# Patient Record
Sex: Female | Born: 1940 | Race: White | Hispanic: No | Marital: Married
Health system: Southern US, Academic
[De-identification: ages and names within clinical notes are randomized; demographics above are authoritative.]

## PROBLEM LIST (undated history)

## (undated) ENCOUNTER — Ambulatory Visit

## (undated) ENCOUNTER — Encounter: Attending: Family Medicine | Primary: Family Medicine

## (undated) ENCOUNTER — Encounter

## (undated) ENCOUNTER — Ambulatory Visit: Payer: MEDICARE

## (undated) ENCOUNTER — Ambulatory Visit: Payer: MEDICARE | Attending: Family Medicine | Primary: Family Medicine

## (undated) ENCOUNTER — Telehealth: Attending: Family Medicine | Primary: Family Medicine

## (undated) ENCOUNTER — Other Ambulatory Visit: Attending: Family Medicine | Primary: Family Medicine

## (undated) ENCOUNTER — Telehealth

## (undated) ENCOUNTER — Other Ambulatory Visit

## (undated) ENCOUNTER — Encounter: Attending: Medical | Primary: Medical

## (undated) ENCOUNTER — Encounter: Attending: Dermatology | Primary: Dermatology

## (undated) ENCOUNTER — Ambulatory Visit: Payer: Medicare (Managed Care) | Attending: Family Medicine | Primary: Family Medicine

## (undated) ENCOUNTER — Encounter: Payer: MEDICARE | Attending: Family Medicine | Primary: Family Medicine

## (undated) ENCOUNTER — Telehealth: Attending: Medical | Primary: Medical

## (undated) ENCOUNTER — Ambulatory Visit: Payer: MEDICARE | Attending: Medical | Primary: Medical

## (undated) ENCOUNTER — Ambulatory Visit: Payer: BLUE CROSS/BLUE SHIELD | Attending: Family Medicine | Primary: Family Medicine

## (undated) ENCOUNTER — Ambulatory Visit: Attending: Family Medicine | Primary: Family Medicine

---

## 2013-09-29 ENCOUNTER — Ambulatory Visit: Payer: Self-pay | Admitting: Orthopedic Surgery

## 2013-09-29 LAB — URINALYSIS, COMPLETE
Blood: NEGATIVE
Nitrite: NEGATIVE
Ph: 8 (ref 4.5–8.0)
Protein: NEGATIVE
Specific Gravity: 1.009 (ref 1.003–1.030)
Squamous Epithelial: <1

## 2013-09-29 LAB — COMPREHENSIVE METABOLIC PANEL
Albumin: 3.9 g/dL (ref 3.4–5.0)
Alkaline Phosphatase: 68 U/L
Anion Gap: 6 — ABNORMAL LOW (ref 7–16)
BUN: 13 mg/dL (ref 7–18)
Bilirubin,Total: 0.7 mg/dL (ref 0.2–1.0)
Calcium, Total: 9.5 mg/dL (ref 8.5–10.1)
Co2: 26 mmol/L (ref 21–32)
EGFR (African American): >60
EGFR (Non-African Amer.): >60
Osmolality: 274 (ref 275–301)
Potassium: 4 mmol/L (ref 3.5–5.1)
SGOT(AST): 24 U/L (ref 15–37)
SGPT (ALT): 19 U/L (ref 12–78)
Sodium: 137 mmol/L (ref 136–145)

## 2013-09-29 LAB — CBC
HGB: 14.3 g/dL (ref 12.0–16.0)
MCH: 31.3 pg (ref 26.0–34.0)
MCHC: 33.7 g/dL (ref 32.0–36.0)
MCV: 93 fL (ref 80–100)
WBC: 7.9 10*3/uL (ref 3.6–11.0)

## 2013-09-29 LAB — PROTIME-INR
INR: 1
Prothrombin Time: 13.2 secs (ref 11.5–14.7)

## 2015-01-20 NOTE — H&P (Signed)
PATIENT NAME:  Victoria McardleLLINGTON, Klynn A MR#:  [id number] DATE OF BIRTH:  [birth year]  DATE OF ADMISSION:  09/29/2013  CHIEF COMPLAINT: Left wrist pain.   HISTORY OF PRESENT ILLNESS: The patient suffered a fall at home earlier this morning. She fell on her outstretched left arm. She came to the Emergency Room with obvious deformity. She had x-rays obtained that showed a comminuted distal radius fracture, intra-articular with significant shortening as well as another styloid fracture. She denies any other injury.   ALLERGIES: SHE HAS PAST HISTORY OF PENICILLIN ALLERGY, SHE GETS HIVES.   MEDICATIONS:  She is not on any current medications.   PAST MEDICAL HISTORY: No chronic health issues.   PAST SURGICAL HISTORY: She does have a history of prior D and C x 2. 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   SOCIAL HISTORY:  She is a nonsmoker. She lives at home with her husband and is generally active. She is right-hand-dominant.   REVIEW OF SYSTEMS: Positive just for the left wrist pain.   PHYSICAL EXAMINATION:  GENERAL: Slender white female, who appears her stated age in mild distress.  HEENT: Remarkable for teeth in good condition. Normocephalic, atraumatic head.  LUNGS: Clear.  HEART: Regular rate and rhythm.  ABDOMEN: Soft, nontender.  EXTREMITIES: Remarkable for silver fork deformity to the left distal radius. She is neurovascularly intact.   X-rays show a comminuted distal radius fracture.   CLINICAL IMPRESSION: Comminuted distal radius fracture. She has been n.p.o. since last night with her fall prior to breakfast and so we can fix this today, prior to onset of swelling. The risks, benefits as well as complications were discussed. We will plan on surgery later  today as an outpatient.    ____________________________ Leitha SchullerMichael J. Arif Amendola, MD mjm:aw D: 09/29/2013 11:04:05 ET T: 09/29/2013 11:10:56 ET JOB#: [id number]  cc: Leitha SchullerMichael J. Nikesha Kwasny, MD, <Dictator> Leitha SchullerMICHAEL J Braydn Carneiro  MD ELECTRONICALLY SIGNED 09/29/2013 12:16

## 2015-01-21 NOTE — Op Note (Signed)
PATIENT NAME:  Victoria [name], Victoria [name] MR#:  [id number] DATE OF BIRTH:  May 30, 1941  DATE OF PROCEDURE:  09/29/2013  PREOPERATIVE DIAGNOSIS: Comminuted left distal radius fracture.   POSTOPERATIVE DIAGNOSIS:  Comminuted left distal radius fracture.   PROCEDURE: Open reduction and internal fixation, left distal radius.   ANESTHESIA: General.   SURGEON: Kennedy BuckerMichael Kaelynne Christley, M.D.   DESCRIPTION OF PROCEDURE: The patient was brought to the Operating Room and after adequate anesthesia was obtained, the left arm was prepped and draped in the usual sterile fashion.  After patient identification and timeout procedures were completed, fingertrap traction was applied with 10 pounds traction. The tourniquet was raised to 250 mmHg. [name] volar incision was made over the FCR tendon, the tendon sheath incised and the tendon retracted radially. The deep fascia was then incised and the pronator retracted ulnarly to protect the median nerve. The fracture site was exposed. There was [name] spike volarly and [name] slight amount of additional traction could be keyed into place to get essentially anatomic alignment. Very good position on AP and lateral projections were obtained. [name] narrow DVR plate, 4-hole plate was applied and the distal peg holes were filled first using standard technique, drilling, measuring and placing the smooth pegs. With the fracture held in [name] reduced position, 3 additional proximal cortical screws were placed, drilling, measuring and placing the self-tapping screws. Traction was released and under fluoroscopy the fracture appeared to be well aligned, essentially anatomic alignment on AP and lateral projections and was stable with passive range of motion. At this point, the tourniquet was let down and the wound irrigated. The wound was closed with 3-0 Vicryl subcutaneously and 4-0 nylon skin suture. Xeroform, 4 x 4, Webril and [name] volar splint were applied followed by an Ace wrap.   TOURNIQUET TIME: 31 minutes at 250 mmHg.    COMPLICATIONS: None.   SPECIMEN: None.   IMPLANT: [employer] DVR left narrow plate with screws and smooth pegs.   ____________________________ Leitha SchullerMichael J. Porschea Borys, MD mjm:cs D: 09/29/2013 17:44:26 ET T: 09/29/2013 20:17:53 ET JOB#: [id number]  cc: Leitha SchullerMichael J. Jakeline Dave, MD, <Dictator> Leitha SchullerMICHAEL J Tela Kotecki MD ELECTRONICALLY SIGNED 09/30/2013 9:24

## 2015-06-25 IMAGING — CR DG CHEST 1V PORT
1 series · 1 of 1 positions shown · non-contrast
Comparison: None.

CLINICAL DATA: Preop.

EXAM:
PORTABLE CHEST - 1 VIEW

[ap]
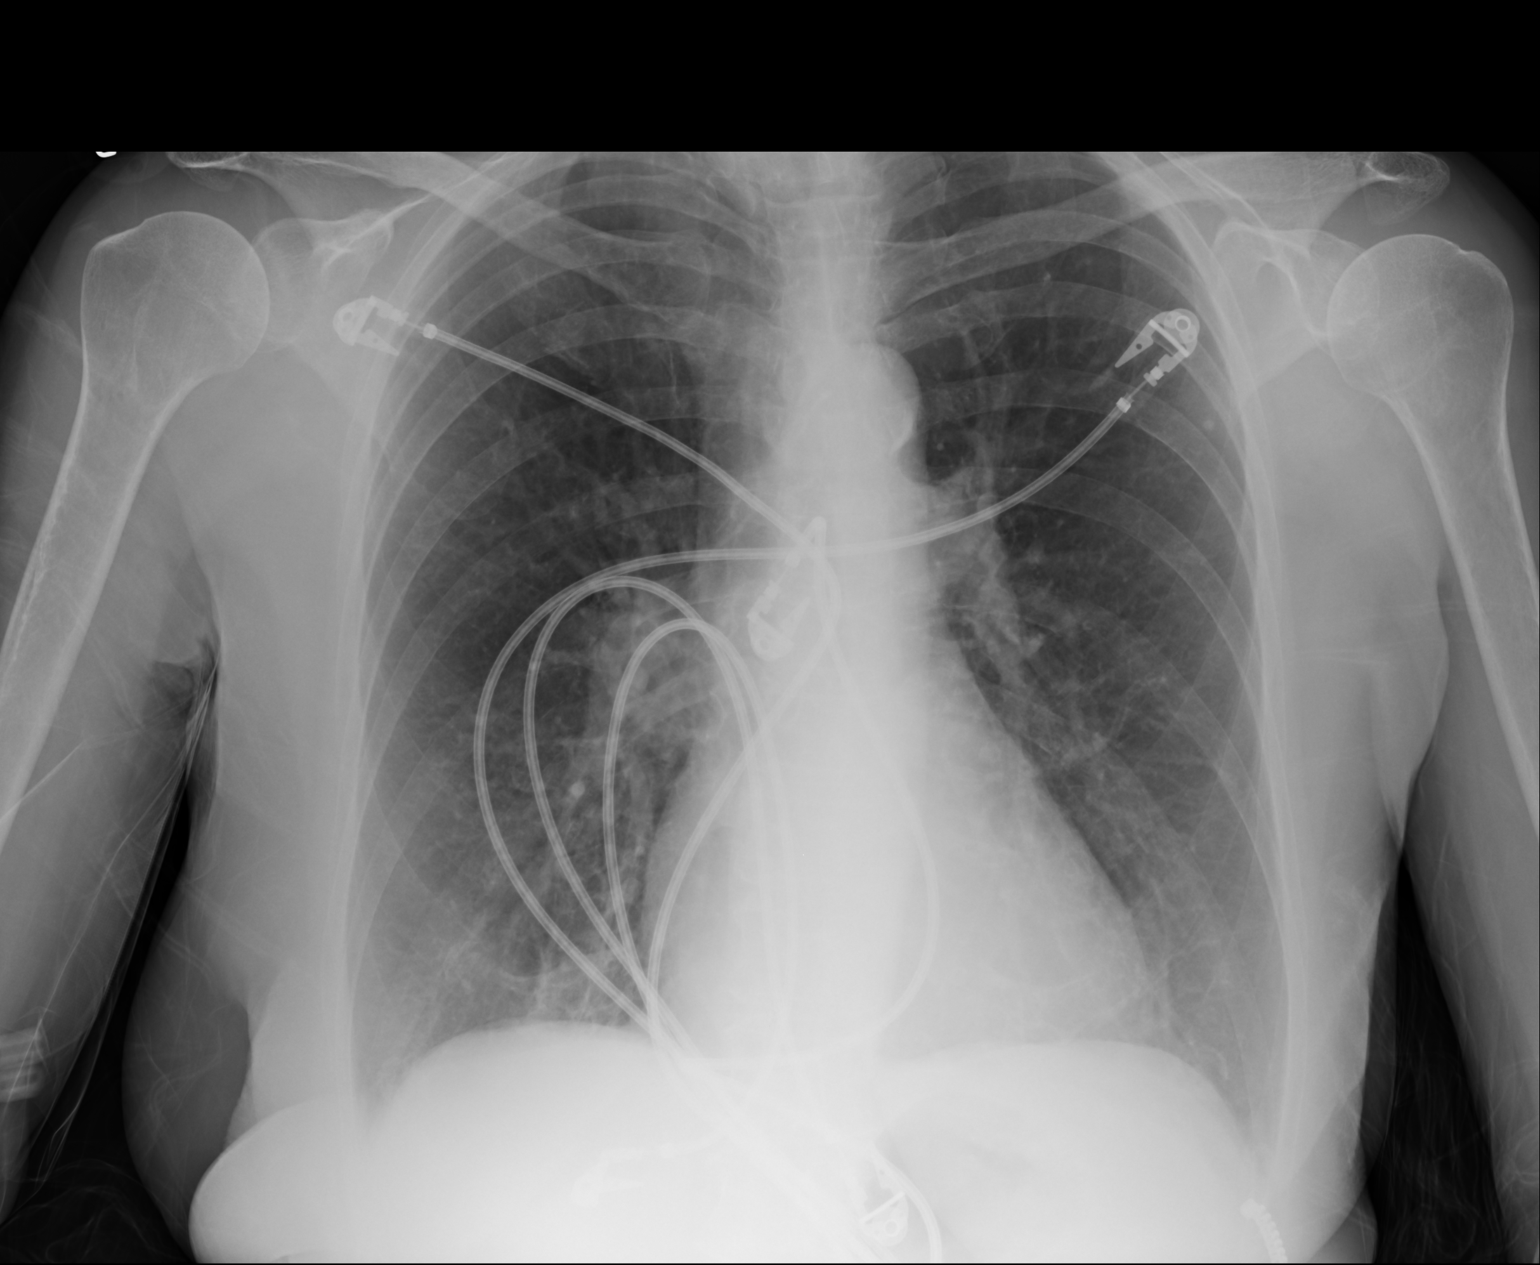

[1 of 1 positions shown; findings below may reference images not displayed]

FINDINGS: Mild hyperinflation of the lungs. Heart is borderline in size. No
confluent airspace opacities, effusions or edema. No acute bony
abnormality.
IMPRESSION: Mild hyperinflation, likely mild COPD.  No active disease.

## 2017-06-09 ENCOUNTER — Ambulatory Visit
Admission: RE | Admit: 2017-06-09 | Discharge: 2017-06-09 | Disposition: A | Discharge status: Home / Self Care | Payer: MEDICARE | Admitting: Family Medicine

## 2017-06-09 DIAGNOSIS — R030 Elevated blood-pressure reading, without diagnosis of hypertension: Secondary | ICD-10-CM

## 2017-06-09 DIAGNOSIS — Z1211 Encounter for screening for malignant neoplasm of colon: Principal | ICD-10-CM

## 2017-06-09 DIAGNOSIS — M810 Age-related osteoporosis without current pathological fracture: Secondary | ICD-10-CM

## 2017-06-09 DIAGNOSIS — R7989 Other specified abnormal findings of blood chemistry: Secondary | ICD-10-CM

## 2017-06-09 DIAGNOSIS — E7800 Pure hypercholesterolemia, unspecified: Secondary | ICD-10-CM

## 2017-06-09 DIAGNOSIS — E559 Vitamin D deficiency, unspecified: Secondary | ICD-10-CM

## 2017-08-11 ENCOUNTER — Ambulatory Visit: Admission: RE | Admit: 2017-08-11 | Discharge: 2017-08-11 | Payer: MEDICARE

## 2017-08-11 DIAGNOSIS — Z1211 Encounter for screening for malignant neoplasm of colon: Principal | ICD-10-CM

## 2018-02-04 ENCOUNTER — Ambulatory Visit: Admit: 2018-02-04 | Discharge: 2018-02-05 | Payer: MEDICARE | Attending: Family Medicine | Primary: Family Medicine

## 2018-02-04 DIAGNOSIS — R030 Elevated blood-pressure reading, without diagnosis of hypertension: Secondary | ICD-10-CM

## 2018-02-04 DIAGNOSIS — L989 Disorder of the skin and subcutaneous tissue, unspecified: Principal | ICD-10-CM

## 2018-02-04 DIAGNOSIS — M810 Age-related osteoporosis without current pathological fracture: Secondary | ICD-10-CM

## 2018-02-04 DIAGNOSIS — R7989 Other specified abnormal findings of blood chemistry: Secondary | ICD-10-CM

## 2018-02-04 DIAGNOSIS — Z23 Encounter for immunization: Secondary | ICD-10-CM

## 2018-02-04 MED ORDER — VARICELLA-ZOSTER GLYCOE VACC-AS01B ADJ(PF) 50 MCG/0.5 ML IM SUSP, KIT
Freq: Once | INTRAMUSCULAR | 0 refills | 0 days | Status: CP
Start: 2018-02-04 — End: 2018-02-04

## 2018-03-02 ENCOUNTER — Encounter: Admit: 2018-03-02 | Discharge: 2018-03-03 | Payer: MEDICARE | Attending: Dermatology | Primary: Dermatology

## 2018-03-02 DIAGNOSIS — L821 Other seborrheic keratosis: Secondary | ICD-10-CM

## 2018-03-02 DIAGNOSIS — D489 Neoplasm of uncertain behavior, unspecified: Principal | ICD-10-CM

## 2018-03-17 ENCOUNTER — Encounter: Admit: 2018-03-17 | Discharge: 2018-03-18 | Payer: MEDICARE

## 2018-03-17 DIAGNOSIS — L578 Other skin changes due to chronic exposure to nonionizing radiation: Secondary | ICD-10-CM

## 2018-03-17 DIAGNOSIS — L988 Other specified disorders of the skin and subcutaneous tissue: Secondary | ICD-10-CM

## 2018-03-17 DIAGNOSIS — L814 Other melanin hyperpigmentation: Secondary | ICD-10-CM

## 2018-03-17 DIAGNOSIS — C4339 Malignant melanoma of other parts of face: Principal | ICD-10-CM

## 2018-03-23 ENCOUNTER — Encounter: Admit: 2018-03-23 | Discharge: 2018-03-24 | Payer: MEDICARE

## 2018-03-23 DIAGNOSIS — Z85828 Personal history of other malignant neoplasm of skin: Principal | ICD-10-CM

## 2018-03-23 MED ORDER — DOXYCYCLINE MONOHYDRATE 100 MG TABLET
ORAL_TABLET | Freq: Two times a day (BID) | ORAL | 0 refills | 0 days | Status: CP
Start: 2018-03-23 — End: 2018-06-06

## 2018-03-26 ENCOUNTER — Encounter: Admit: 2018-03-26 | Discharge: 2018-03-27 | Payer: MEDICARE

## 2018-03-26 DIAGNOSIS — Z85828 Personal history of other malignant neoplasm of skin: Principal | ICD-10-CM

## 2018-04-07 ENCOUNTER — Encounter: Admit: 2018-04-07 | Discharge: 2018-04-08 | Payer: MEDICARE

## 2018-04-07 DIAGNOSIS — Z85828 Personal history of other malignant neoplasm of skin: Principal | ICD-10-CM

## 2018-05-18 ENCOUNTER — Ambulatory Visit: Admit: 2018-05-18 | Discharge: 2018-05-19 | Payer: MEDICARE

## 2018-05-18 DIAGNOSIS — Z85828 Personal history of other malignant neoplasm of skin: Principal | ICD-10-CM

## 2018-06-05 DIAGNOSIS — I10 Essential (primary) hypertension: Principal | ICD-10-CM

## 2018-06-06 ENCOUNTER — Ambulatory Visit: Admit: 2018-06-06 | Discharge: 2018-06-06 | Disposition: A | Discharge status: Home / Self Care | Payer: MEDICARE

## 2018-06-06 ENCOUNTER — Emergency Department: Admit: 2018-06-06 | Discharge: 2018-06-06 | Disposition: A | Discharge status: Home / Self Care | Payer: MEDICARE

## 2018-06-06 ENCOUNTER — Encounter: Admit: 2018-06-06 | Discharge: 2018-06-06 | Disposition: A | Discharge status: Home / Self Care | Payer: MEDICARE

## 2018-06-06 DIAGNOSIS — I10 Essential (primary) hypertension: Principal | ICD-10-CM

## 2018-06-09 ENCOUNTER — Encounter: Admit: 2018-06-09 | Discharge: 2018-06-10 | Payer: MEDICARE

## 2018-06-09 ENCOUNTER — Encounter: Admit: 2018-06-09 | Discharge: 2018-06-10 | Payer: MEDICARE | Attending: Family Medicine | Primary: Family Medicine

## 2018-06-09 DIAGNOSIS — Z0000 Encounter for general adult medical examination without abnormal findings: Principal | ICD-10-CM

## 2018-06-09 DIAGNOSIS — R030 Elevated blood-pressure reading, without diagnosis of hypertension: Secondary | ICD-10-CM

## 2018-06-09 DIAGNOSIS — F411 Generalized anxiety disorder: Principal | ICD-10-CM

## 2018-06-09 DIAGNOSIS — R3129 Other microscopic hematuria: Secondary | ICD-10-CM

## 2018-06-09 MED ORDER — BUSPIRONE 5 MG TABLET
ORAL_TABLET | Freq: Two times a day (BID) | ORAL | 1 refills | 0 days | Status: CP
Start: 2018-06-09 — End: 2018-08-26

## 2018-06-19 ENCOUNTER — Encounter: Admit: 2018-06-19 | Discharge: 2018-06-20 | Payer: MEDICARE | Attending: Clinical | Primary: Clinical

## 2018-06-19 DIAGNOSIS — Z1330 Encounter for screening examination for mental health and behavioral disorders, unspecified: Secondary | ICD-10-CM

## 2018-06-19 DIAGNOSIS — F411 Generalized anxiety disorder: Principal | ICD-10-CM

## 2018-06-24 ENCOUNTER — Encounter: Admit: 2018-06-24 | Discharge: 2018-06-25 | Payer: MEDICARE | Attending: Family Medicine | Primary: Family Medicine

## 2018-06-24 DIAGNOSIS — F411 Generalized anxiety disorder: Principal | ICD-10-CM

## 2018-06-24 DIAGNOSIS — R3129 Other microscopic hematuria: Secondary | ICD-10-CM

## 2018-06-24 DIAGNOSIS — R030 Elevated blood-pressure reading, without diagnosis of hypertension: Secondary | ICD-10-CM

## 2018-08-10 ENCOUNTER — Ambulatory Visit: Admit: 2018-08-10 | Discharge: 2018-08-11 | Payer: MEDICARE

## 2018-08-10 DIAGNOSIS — Z85828 Personal history of other malignant neoplasm of skin: Principal | ICD-10-CM

## 2018-08-26 ENCOUNTER — Encounter: Admit: 2018-08-26 | Discharge: 2018-08-27 | Payer: MEDICARE | Attending: Family Medicine | Primary: Family Medicine

## 2018-08-26 DIAGNOSIS — R030 Elevated blood-pressure reading, without diagnosis of hypertension: Secondary | ICD-10-CM

## 2018-08-26 DIAGNOSIS — F411 Generalized anxiety disorder: Principal | ICD-10-CM

## 2018-08-26 MED ORDER — BUSPIRONE 5 MG TABLET
ORAL_TABLET | Freq: Two times a day (BID) | ORAL | 3 refills | 0 days | Status: CP
Start: 2018-08-26 — End: 2019-05-11

## 2018-12-28 ENCOUNTER — Encounter: Admit: 2018-12-28 | Discharge: 2018-12-29 | Payer: MEDICARE | Attending: Family Medicine | Primary: Family Medicine

## 2018-12-28 DIAGNOSIS — I10 Essential (primary) hypertension: Principal | ICD-10-CM

## 2018-12-28 DIAGNOSIS — F411 Generalized anxiety disorder: Principal | ICD-10-CM

## 2018-12-28 DIAGNOSIS — M1990 Unspecified osteoarthritis, unspecified site: Principal | ICD-10-CM

## 2018-12-28 DIAGNOSIS — R030 Elevated blood-pressure reading, without diagnosis of hypertension: Principal | ICD-10-CM

## 2019-05-11 ENCOUNTER — Encounter: Admit: 2019-05-11 | Discharge: 2019-05-12 | Payer: MEDICARE | Attending: Family Medicine | Primary: Family Medicine

## 2019-05-11 DIAGNOSIS — R946 Abnormal results of thyroid function studies: Secondary | ICD-10-CM

## 2019-05-11 DIAGNOSIS — F411 Generalized anxiety disorder: Principal | ICD-10-CM

## 2019-05-11 DIAGNOSIS — E559 Vitamin D deficiency, unspecified: Secondary | ICD-10-CM

## 2019-05-11 DIAGNOSIS — R7989 Other specified abnormal findings of blood chemistry: Secondary | ICD-10-CM

## 2019-05-11 DIAGNOSIS — Z85820 Personal history of malignant melanoma of skin: Secondary | ICD-10-CM

## 2019-05-11 DIAGNOSIS — R3129 Other microscopic hematuria: Secondary | ICD-10-CM

## 2019-05-11 DIAGNOSIS — M810 Age-related osteoporosis without current pathological fracture: Secondary | ICD-10-CM

## 2019-05-11 DIAGNOSIS — E7800 Pure hypercholesterolemia, unspecified: Secondary | ICD-10-CM

## 2019-05-11 DIAGNOSIS — R030 Elevated blood-pressure reading, without diagnosis of hypertension: Secondary | ICD-10-CM

## 2019-05-11 MED ORDER — BUSPIRONE 5 MG TABLET
ORAL_TABLET | Freq: Two times a day (BID) | ORAL | 6 refills | 30 days | Status: CP
Start: 2019-05-11 — End: 2020-05-10

## 2019-10-05 ENCOUNTER — Encounter: Admit: 2019-10-05 | Discharge: 2019-10-06 | Payer: MEDICARE | Attending: Family Medicine | Primary: Family Medicine

## 2019-10-05 DIAGNOSIS — F411 Generalized anxiety disorder: Principal | ICD-10-CM

## 2019-10-05 DIAGNOSIS — R030 Elevated blood-pressure reading, without diagnosis of hypertension: Principal | ICD-10-CM

## 2019-11-04 ENCOUNTER — Encounter: Admit: 2019-11-04 | Discharge: 2019-11-05 | Payer: MEDICARE | Attending: Family Medicine | Primary: Family Medicine

## 2019-11-04 DIAGNOSIS — F411 Generalized anxiety disorder: Principal | ICD-10-CM

## 2019-11-04 DIAGNOSIS — I10 Essential (primary) hypertension: Principal | ICD-10-CM

## 2019-11-04 MED ORDER — AMLODIPINE 2.5 MG TABLET
ORAL_TABLET | Freq: Every day | ORAL | 1 refills | 30 days | Status: CP
Start: 2019-11-04 — End: 2020-11-03

## 2019-11-22 ENCOUNTER — Encounter: Admit: 2019-11-22 | Discharge: 2019-11-23 | Payer: MEDICARE | Attending: Family Medicine | Primary: Family Medicine

## 2019-11-22 DIAGNOSIS — I10 Essential (primary) hypertension: Principal | ICD-10-CM

## 2019-11-22 MED ORDER — AMLODIPINE 2.5 MG TABLET
ORAL_TABLET | Freq: Every day | ORAL | 6 refills | 30 days | Status: CP
Start: 2019-11-22 — End: 2020-11-21

## 2020-01-20 ENCOUNTER — Encounter: Admit: 2020-01-20 | Discharge: 2020-01-21 | Payer: MEDICARE | Attending: Family Medicine | Primary: Family Medicine

## 2020-01-20 DIAGNOSIS — E7800 Pure hypercholesterolemia, unspecified: Principal | ICD-10-CM

## 2020-01-20 DIAGNOSIS — F411 Generalized anxiety disorder: Principal | ICD-10-CM

## 2020-01-20 DIAGNOSIS — M810 Age-related osteoporosis without current pathological fracture: Principal | ICD-10-CM

## 2020-01-20 DIAGNOSIS — R3129 Other microscopic hematuria: Principal | ICD-10-CM

## 2020-01-20 DIAGNOSIS — I10 Essential (primary) hypertension: Principal | ICD-10-CM

## 2020-01-20 DIAGNOSIS — Z85820 Personal history of malignant melanoma of skin: Principal | ICD-10-CM

## 2020-05-03 ENCOUNTER — Encounter: Admit: 2020-05-03 | Discharge: 2020-05-03 | Payer: MEDICARE | Attending: Family Medicine | Primary: Family Medicine

## 2020-05-03 ENCOUNTER — Encounter: Admit: 2020-05-03 | Discharge: 2020-05-03 | Payer: MEDICARE

## 2020-05-03 DIAGNOSIS — F411 Generalized anxiety disorder: Principal | ICD-10-CM

## 2020-05-03 DIAGNOSIS — E7800 Pure hypercholesterolemia, unspecified: Principal | ICD-10-CM

## 2020-05-03 DIAGNOSIS — M810 Age-related osteoporosis without current pathological fracture: Principal | ICD-10-CM

## 2020-05-03 DIAGNOSIS — R946 Abnormal results of thyroid function studies: Principal | ICD-10-CM

## 2020-05-03 DIAGNOSIS — Z0000 Encounter for general adult medical examination without abnormal findings: Principal | ICD-10-CM

## 2020-05-03 DIAGNOSIS — E559 Vitamin D deficiency, unspecified: Principal | ICD-10-CM

## 2020-05-03 DIAGNOSIS — I10 Essential (primary) hypertension: Principal | ICD-10-CM

## 2020-05-03 DIAGNOSIS — R3129 Other microscopic hematuria: Principal | ICD-10-CM

## 2020-05-03 DIAGNOSIS — Z85820 Personal history of malignant melanoma of skin: Principal | ICD-10-CM

## 2020-11-16 ENCOUNTER — Encounter: Admit: 2020-11-16 | Discharge: 2020-11-17 | Payer: MEDICARE

## 2020-12-21 ENCOUNTER — Ambulatory Visit: Admit: 2020-12-21 | Discharge: 2020-12-22 | Payer: MEDICARE

## 2020-12-25 ENCOUNTER — Encounter: Admit: 2020-12-25 | Discharge: 2020-12-26 | Payer: MEDICARE | Attending: Family Medicine | Primary: Family Medicine

## 2020-12-25 DIAGNOSIS — M810 Age-related osteoporosis without current pathological fracture: Principal | ICD-10-CM

## 2020-12-25 DIAGNOSIS — E559 Vitamin D deficiency, unspecified: Principal | ICD-10-CM

## 2020-12-25 DIAGNOSIS — Z85820 Personal history of malignant melanoma of skin: Principal | ICD-10-CM

## 2020-12-25 DIAGNOSIS — R946 Abnormal results of thyroid function studies: Principal | ICD-10-CM

## 2020-12-25 DIAGNOSIS — E7800 Pure hypercholesterolemia, unspecified: Principal | ICD-10-CM

## 2020-12-25 DIAGNOSIS — I10 Essential (primary) hypertension: Principal | ICD-10-CM

## 2020-12-25 DIAGNOSIS — F411 Generalized anxiety disorder: Principal | ICD-10-CM

## 2020-12-25 MED ORDER — AMLODIPINE 2.5 MG TABLET
ORAL_TABLET | Freq: Every day | ORAL | 6 refills | 30 days | Status: CP
Start: 2020-12-25 — End: 2021-12-25

## 2021-07-05 ENCOUNTER — Ambulatory Visit: Admit: 2021-07-05 | Discharge: 2021-07-06 | Payer: MEDICARE | Attending: Family Medicine | Primary: Family Medicine

## 2021-07-05 DIAGNOSIS — I10 Essential (primary) hypertension: Principal | ICD-10-CM

## 2021-07-05 DIAGNOSIS — F411 Generalized anxiety disorder: Principal | ICD-10-CM

## 2021-07-05 DIAGNOSIS — M810 Age-related osteoporosis without current pathological fracture: Principal | ICD-10-CM

## 2021-07-05 DIAGNOSIS — Z85820 Personal history of malignant melanoma of skin: Principal | ICD-10-CM

## 2021-07-05 DIAGNOSIS — E7800 Pure hypercholesterolemia, unspecified: Principal | ICD-10-CM

## 2021-07-05 MED ORDER — AMLODIPINE 2.5 MG TABLET
ORAL_TABLET | Freq: Every day | ORAL | 6 refills | 30 days | Status: CP
Start: 2021-07-05 — End: 2022-07-05

## 2021-07-05 MED ORDER — ALENDRONATE 70 MG TABLET
ORAL_TABLET | ORAL | 6 refills | 28 days | Status: CP
Start: 2021-07-05 — End: 2022-07-05

## 2022-01-03 ENCOUNTER — Ambulatory Visit: Admit: 2022-01-03 | Discharge: 2022-01-04 | Payer: MEDICARE | Attending: Family Medicine | Primary: Family Medicine

## 2022-01-03 DIAGNOSIS — E7800 Pure hypercholesterolemia, unspecified: Principal | ICD-10-CM

## 2022-01-03 DIAGNOSIS — Z85820 Personal history of malignant melanoma of skin: Principal | ICD-10-CM

## 2022-01-03 DIAGNOSIS — I10 Essential (primary) hypertension: Principal | ICD-10-CM

## 2022-01-03 DIAGNOSIS — F411 Generalized anxiety disorder: Principal | ICD-10-CM

## 2022-01-03 DIAGNOSIS — M810 Age-related osteoporosis without current pathological fracture: Principal | ICD-10-CM

## 2022-02-14 ENCOUNTER — Ambulatory Visit: Admit: 2022-02-14 | Discharge: 2022-02-15 | Payer: MEDICARE

## 2022-04-20 DIAGNOSIS — M810 Age-related osteoporosis without current pathological fracture: Principal | ICD-10-CM

## 2022-05-22 ENCOUNTER — Ambulatory Visit: Admit: 2022-05-22 | Discharge: 2022-05-23 | Payer: MEDICARE | Attending: Medical | Primary: Medical

## 2022-05-22 DIAGNOSIS — R634 Abnormal weight loss: Principal | ICD-10-CM

## 2022-05-22 DIAGNOSIS — R030 Elevated blood-pressure reading, without diagnosis of hypertension: Principal | ICD-10-CM

## 2022-05-22 DIAGNOSIS — M810 Age-related osteoporosis without current pathological fracture: Principal | ICD-10-CM

## 2022-05-22 MED ORDER — DENOSUMAB 60 MG/ML SUBCUTANEOUS SYRINGE
SUBCUTANEOUS | 1 refills | 180 days | Status: CP
Start: 2022-05-22 — End: ?
  Filled 2022-05-30: qty 1, 180d supply, fill #0

## 2022-05-23 LAB — PARATHYROID HORMONE (PTH): PARATHYROID HORMONE INTACT: 46.5 pg/mL (ref 18.4–80.1)

## 2022-05-23 NOTE — Unmapped (Signed)
[facility] Specialty Medication Onboarding    Specialty Medication: Prolia  Prior Authorization: Not Required   Financial Assistance: No - copay  <$25  Final Copay/Day Supply: $10.35 / 180    Insurance Restrictions: None     Notes to Pharmacist:     The triage team has completed the benefits investigation and has determined that the patient is able to fill this medication at [facility]. Please contact the patient to complete the onboarding or follow up with the prescribing physician as needed.

## 2022-05-23 NOTE — Unmapped (Signed)
[facility]   Patient Onboarding/Medication Counseling    Anita Solis is a 81 y.o. female with Age-related osteoporosis without current pathological fracture who I am counseling today on initiation of therapy.  I am speaking to the patient.    Was a Nurse, learning disability used for this call? No    Verified patient's date of birth / HIPAA.    Specialty medication(s) to be sent: General Specialty: Prolia      Non-specialty medications/supplies to be sent: n/a      Medications not needed at this time: n/a         Prolia (denosumbab) syringe    Medication & Administration     Dosage: Inject 1ml (60mg ) under the skin once every 6 months    Administration: Administered in clinic    Missed dose instructions: If you miss a dose, administer it as soon as possible. Fracture risk may increase if dose is not administered within 7 months of prior dose. Thereafter, schedule doses every 6 months from the date of the last injection.      Goals of Therapy     Prevention of osteoporosis/bone loss    Side Effects & Monitoring Parameters     Injection site irritation  Back pain  Headache  Signs of common cold  Pain in arms or legs  Muscle or joint pain  Osteonecrosis of the jaw (jaw bone pain or numbness of the jaw)(should have routine dental exams)      The following side effects should be reported to the provider:  Low calcium levels, like muscle cramps or spasms, numbness and tingling, or seizures  Mouth sores  Swelling in arms or legs  Feeling very tired or weak  New or strange groin, hip, or thigh pain  Shortness of breath  Jaw swelling or pain  Pancreas problem like very bad stomach pain, very bad back pain, or very bad upset stomach or throwing up  Infection like fever, chills, very bad sore throat, ear or sinus pain, cough, more sputum or change in color of sputum, pain with passing urine, or wound that will not heal  Skin infection like oozing, heat, swelling, redness, or pain  High or low blood pressure like very bad headache or dizziness, passing out, or change in eyesight  Small bumps or patches on your skin, dry skin, or if your skin feels like leather  Bladder pain or pain when passing urine or in how much or frequency with which urine is passed    Contraindications, Warnings, & Precautions     Hypersensitivity to denosumab  Hypersensitivity to latex (needle cap and vial stopper contains latex)  Osteonecrosis of the jaw - should have dental exam prior to starting treatment  Unusual thigh bone fractures  Increased risk of broken bones, including broken bones in the spine, after stopping, skipping, or delaying Prolia  Serious infections  May cause hypocalcemia - May require calcium and Vitamin D supplements  REMS Program - no pharmacy requirements    Drug/Food Interactions     Medication list reviewed in Epic. The patient was instructed to inform the care team before taking any new medications or supplements. No drug interactions identified.   May need calcium and Vitamin D supplements    Storage, Handling Precautions, & Disposal     Prolia should be stored in the refrigerator.   Avoid freezing   Throw away if it has been left at room temperature for more than 14 days  Do not shake  Protect from direct heat and light  Keep out of the reach of children  Only use the syringe/needle one time then discard  Place used devices into a sharps container for disposal (which we can supply along with band-aids and alcohol pads) or hard plastic container       Current Medications (including OTC/herbals), Comorbidities and Allergies     Current Outpatient Medications   Medication Sig Dispense Refill    amLODIPine (NORVASC) 2.5 MG tablet Take 1 tablet (2.5 mg total) by mouth daily. 30 tablet 6    aspirin (ECOTRIN) 81 MG tablet Take 1 tablet (81 mg total) by mouth daily. (Patient not taking: Reported on 05/22/2022) 150 tablet 2    calcium carbonate-vitamin D3 600 mg (1,500 mg)-800 unit Chew Chew 1 each Two (2) times a day. clindamycin (CLEOCIN) 150 MG capsule TAKE 4 CAPSULES BY MOUTH 1 HOUR BEFORE APPT      denosumab (PROLIA) 60 mg/mL Syrg Inject 1 mL (60 mg total) under the skin every six (6) months. 1 mL 1    omega-3/dha/epa/fish oil (FISH OIL-OMEGA-3 FATTY ACIDS) 300-1,000 mg capsule Take 1 capsule (1 g total) by mouth daily. Takes one 2 times per day      zinc sulfate (ZINC-220 ORAL) Take 22 mg by mouth daily.       No current facility-administered medications for this visit.       Allergies   Allergen Reactions    Penicillins      Other reaction(s): RASH       Patient Active Problem List   Diagnosis    Vitamin D deficiency    Mammogram declined    Colonoscopy refused    Refused pneumococcal vaccine    Osteoporosis    Serum calcium elevated    Elevated TSH    White coat syndrome with high blood pressure without hypertension    Refused influenza vaccine    Refused pneumococcal vaccination    Pure hypercholesterolemia    Microhematuria    Generalized anxiety disorder    History of malignant melanoma    Melanoma of cheek (CMS-HCC)       Reviewed and up to date in Epic.    Appropriateness of Therapy     Acute infections noted within Epic:  No active infections  Patient reported infection: None    Is medication and dose appropriate based on diagnosis and infection status? Yes    Prescription has been clinically reviewed: Yes      Baseline Quality of Life Assessment      How many days over the past month did your condition  keep you from your normal activities? For example, brushing your teeth or getting up in the morning. 0    Financial Information     Medication Assistance provided: None Required    Anticipated copay of $10.35 reviewed with patient. Verified delivery address.    Delivery Information     Scheduled delivery date: 05/31/22    Expected start date: TBD - needs to make MD appt and take with her     Medication will be delivered via UPS to the prescription address in [facility].  This shipment will not require a signature. Explained the services we provide at [facility] and that each month we would call to set up refills.  Stressed importance of returning phone calls so that we could ensure they receive their medications in time each month.  Informed patient that we should be setting up refills 7-10  days prior to when they will run out of medication.  A pharmacist will reach out to perform a clinical assessment periodically.  Informed patient that a welcome packet, containing information about our pharmacy and other support services, a Notice of Privacy Practices, and a drug information handout will be sent.      The patient or caregiver noted above participated in the development of this care plan and knows that they can request review of or adjustments to the care plan at any time.      Patient or caregiver verbalized understanding of the above information as well as how to contact the pharmacy at [phone] option 4 with any questions/concerns.  The pharmacy is open Monday through Friday 8:30am-4:30pm.  A pharmacist is available 24/7 via pager to answer any clinical questions they may have.    Patient Specific Needs     Does the patient have any physical, cognitive, or cultural barriers? No    Does the patient have adequate living arrangements? (i.e. the ability to store and take their medication appropriately) Yes    Did you identify any home environmental safety or security hazards? No    Patient prefers to have medications discussed with  Patient     Is the patient or caregiver able to read and understand education materials at a high school level or above? Yes    Patient's primary language is  English     Is the patient high risk? No    SOCIAL DETERMINANTS OF HEALTH     At the [facility], we have learned that life circumstances - like trouble affording food, housing, utilities, or transportation can affect the health of many of our patients.   That is why we wanted to ask: are you currently experiencing any life circumstances that are negatively impacting your health and/or quality of life? No    Social Determinants of Health     Financial Resource Strain: Low Risk  (05/03/2020)    Overall Financial Resource Strain (CARDIA)     Difficulty of Paying Living Expenses: Not hard at all   Internet Connectivity: Not on file   Food Insecurity: No Food Insecurity (05/03/2020)    Hunger Vital Sign     Worried About Running Out of Food in the Last Year: Never true     Ran Out of Food in the Last Year: Never true   Tobacco Use: Low Risk  (01/03/2022)    Patient History     Smoking Tobacco Use: Never     Smokeless Tobacco Use: Never     Passive Exposure: Not on file   Housing/Utilities: Low Risk  (05/03/2020)    Housing/Utilities     Within the past 12 months, have you ever stayed: outside, in a car, in a tent, in an overnight shelter, or temporarily in someone else's home (i.e. couch-surfing)?: No     Are you worried about losing your housing?: No     Within the past 12 months, have you been unable to get utilities (heat, electricity) when it was really needed?: No   Alcohol Use: Not At Risk (01/03/2022)    Alcohol Use     How often do you have a drink containing alcohol?: Never     How many drinks containing alcohol do you have on a typical day when you are drinking?: 1 - 2     How often do you have 5 or more drinks on one occasion?: Never   Transportation Needs: No Transportation  Needs (01/03/2022)    PRAPARE - Transportation     Lack of Transportation (Medical): No     Lack of Transportation (Non-Medical): No   Substance Use: Low Risk  (05/03/2020)    Substance Use     Taken prescription drugs for non-medical reasons: Never     Taken illegal drugs: Never     Patient indicated they have taken drugs in the past year for non-medical reasons: Yes, [positive answer(s)]: Not on file   Health Literacy: Low Risk  (05/03/2020)    Health Literacy     : Never   Physical Activity: Sufficiently Active (05/03/2020)    Exercise Vital Sign Days of Exercise per Week: 7 days     Minutes of Exercise per Session: 30 min   Interpersonal Safety: Not on file   Stress: No Stress Concern Present (05/03/2020)    [employer] of Occupational Health - Occupational Stress Questionnaire     Feeling of Stress : Not at all   Intimate Partner Violence: Not At Risk (05/03/2020)    Humiliation, Afraid, Rape, and Kick questionnaire     Fear of Current or Ex-Partner: No     Emotionally Abused: No     Physically Abused: No     Sexually Abused: No   Depression: Not at risk (12/25/2020)    PHQ-2     PHQ-2 Score: 0   Social Connections: Moderately Integrated (05/03/2020)    Social Connection and Isolation Panel [NHANES]     Frequency of Communication with Friends and Family: More than three times a week     Frequency of Social Gatherings with Friends and Family: More than three times a week     Attends Religious Services: More than 4 times per year     Active Member of [employer] or Organizations: No     Attends Banker Meetings: Never     Marital Status: Married       Would you be willing to receive help with any of the needs that you have identified today? Not applicable       Shaylea Ucci Vangie Bicker  [facility] Specialty Pharmacist

## 2022-05-27 LAB — VITAMIN D 25 HYDROXY: VITAMIN D, TOTAL (25OH): 49.3 ng/mL (ref 20.0–80.0)

## 2022-05-28 DIAGNOSIS — M810 Age-related osteoporosis without current pathological fracture: Principal | ICD-10-CM

## 2022-05-30 ENCOUNTER — Institutional Professional Consult (permissible substitution): Admit: 2022-05-30 | Discharge: 2022-05-31 | Payer: MEDICARE

## 2022-05-30 DIAGNOSIS — Z0000 Encounter for general adult medical examination without abnormal findings: Principal | ICD-10-CM

## 2022-05-30 NOTE — Unmapped (Addendum)
Thank kindly you for completing your Medicare Annual Wellness Visit today. If you have any questions about your Medicare Annual Wellness Visit please call our Care Manager, Johnanna Schneiders, RN CM at [phone]  Please see your updated Personalized Prevention Plan which details the status of your preventative services, agreed upon goals to improve your fitness and health, and educational materials on health topics specific to you.     Here is your personalized prevention plan based on your Annual Wellness Visit today.    Medicare Screening & Prevention Guidelines Recommendations Dates Completed HM Status and Next Due Follow-Up   Colorectal Cancer Screening Patients 50 to 75: stool cards annually OR colonoscopy every 10 years (or more frequently if high risk) OR FIT-DNA every 3 years.  Colonoscopy date: Not Found  FOBT/FIT date: Not Found  Sigmoidoscopy date: Not Found  FIT-DNA date: Not Found Health Maintenance Summary     This patient has no relevant Health Maintenance data.       Not within age range   DEXA Bone Density Measurement Patients age 44-95 to have a Dexa every 5 years in postmenopausal women and high risk males. Males will defer to PCP. DEXA date: 02/14/2022 Health Maintenance Summary    -      DEXA Scan (Every 5 Years) Next due on 02/15/2027    02/14/2022  Dexa Bone Density Skeletal    05/25/2014  Dexa Bone Density Skeletal    10/09/2010  Diagnostic DEXA [facility] Historical Result)             Up to date   Heart Disease Screening (fasting lipid panel) Minimum of every 5 years, patients age 64-75,  if no apparent signs or symptoms of heart disease. LDL date: 07/05/2021  Total choleseterol date: 07/05/2021  HDL date: 07/05/2021  Triglycerides date: 07/05/2021 Health Maintenance Summary     This patient has no relevant Health Maintenance data.       Not within age range   Mammogram Screening Age 80-74 every 2 years.  Mammogram date: 08/14/2016 Health Maintenance Summary     This patient has no relevant Health Maintenance data.     Not within age range   Pelvic Exam & Pap Smear Women ages 16 to 34 every 3 years with negative cytology (pap smear)  OR,   women ages 63 to 18 every 5 years if they have had both a negative pap and human papillomavirus (HPV) OR,  Every 3 years if they had a positive HPV result Pap Smear date: Not Found  HPV date: Not Found Health Maintenance Summary     This patient has no relevant Health Maintenance data.     Not within age range   Hepatitis C Screening A one-time screening for HCV infection for adults age 26 to 81 years old. HCV screening date: Not Found Health Maintenance Summary     This patient has no relevant Health Maintenance data.       Not within age range   Covid-19 Vaccine For persons 5 and older @[id number] @ Health Maintenance   Topic Date Due    COVID-19 Vaccine (3 - Pfizer risk series) 01/18/2021      Provided information on how to obtain at pharmacy   Tdap Every 10 years (will not be covered by Medicare) DTap/Tdap/TD vaccination: Not Found Health Maintenance Summary    -      Overdue - DTaP/Tdap/Td Vaccines (1 - Tdap) Overdue - never done    No completion history exists for this topic.  Provided information on how to obtain at pharmacy, health department, PCP office   Influenza Vaccine Annually  Influenza Vaccination: Not Found   Health Maintenance Summary    -      Influenza Vaccine (1) Next due on 05/31/2022    No completion history exists for this topic.             Next due 05/31/22   PneumococcalVaccine For people ?65, or with an underlying condition Pneumonia vaccination: Not Found   Health Maintenance Summary    -      Overdue - Pneumococcal Vaccine 65+ (1 - PCV) Overdue - never done    No completion history exists for this topic.             Provided information on how to obtain at pharmacy, PCP office   Zoster Vaccine Healthy adults 50 years and older receive 2 doses of recombinant zoster vaccine two to six months apart (may not be covered by Medicare).  Zoster vaccination: Not Found   Health Maintenance Summary    -      Overdue - Zoster Vaccines (1 of 2) Overdue - never done    No completion history exists for this topic.           Provided information on how to obtain at pharmacy-Shingrix   Diabetes Screening  Annually for patients 40-70, if risk factors (family hx of DM, hx of gestational DM, and/or PCOS), twice per year if diagnosed with pre-diabetes.    Range 65-99 Diabetes screening date: 05/22/2022 N/A Not applicable         Patient Education        Well Visit, Over 65: Care Instructions  Well visits can help you stay healthy. Your doctor has checked your overall health and may have suggested ways to take good care of yourself. Your doctor also may have recommended tests. You can help prevent illness with healthy eating, good sleep, vaccinations, regular exercise, and other steps.    Get the tests that you and your doctor decide on. Depending on your age and risks, examples might include hearing tests as well as screening for colon, breast, and lung cancer. Screening helps find diseases before any symptoms appear.   Eat healthy foods. Choose fruits, vegetables, whole grains, lean protein, and low-fat dairy foods. Limit saturated fat, and reduce salt.     Limit alcohol. Men should have no more than 2 drinks a day. Women should have no more than 1. For some people, no alcohol is the best choice.   Exercise. It can help prevent falls. Get at least 30 minutes of exercise on most days of the week. Walking, yoga, and tai chi can be good choices.     Reach and stay at your healthy weight. This will lower your risk for many health problems.   Take care of your mental health. Try to stay connected with friends, family, and community, and find ways to manage stress.     If you're feeling depressed or hopeless, talk to someone. A counselor can help. If you don't have a counselor, talk to your doctor.   Talk to your doctor if you think you may have a problem with alcohol or drug use. This includes prescription medicines and illegal drugs.     Avoid tobacco and nicotine: Don't smoke, vape, or chew. If you need help quitting, talk to your doctor.   Practice safer sex. Getting tested, using condoms or dental dams, and limiting sex partners  can help prevent STIs.     Make an advance directive. This is a legal way to tell your family and doctor what you want to happen at the end of your life or when you can't speak for yourself.   Prevent problems where you can. Protect your skin from too much sun, wash your hands, brush your teeth twice a day, and wear a seat belt in the car.   Where can you learn more?  Go to [facility] at https://my[facility]chart.Armed forces logistics/support/administrative officer in the Menu. Enter [phone] in the search box to learn more about Well Visit, Over 65: Care Instructions.  Current as of: August 13, 2021               Content Version: 13.7  ?? 2006-2023 [employer].   Care instructions adapted under license by [facility]. If you have questions about a medical condition or this instruction, always ask your healthcare professional. [employer] disclaims any warranty or liability for your use of this information.    *Please provide your completed and notarized advance directive documents such as living will and/or healthcare power of attorney to the office to be scanned to your medical record.        If you need care after 5:00 pm during the week or on the weekend: Call [facility] at [phone] for advice.

## 2022-05-30 NOTE — Unmapped (Signed)
ADVANCE CARE PLANNING NOTE    Discussion Date:  May 30, 2022    Patient has decisional capacity:  Yes    Patient has selected a Health Care Decision-Maker if loses capacity: Yes    Health Care Decision Maker as of 05/30/2022    HCDM (patient stated preference): Elzie Rings - [phone]    HCDM, First Alternate: Raphaelle, Prevette [phone]    Discussion Participants:  Patient and RNCM     Communication of Medical Status/Prognosis:   Patient states my overall health is good and has no other concerns at this time.      Communication of Treatment Goals/Options:   Patient completed. Denies changes     Treatment Decisions:   05/30/2022    William Hamburger, MD was present and immediately available in office suite.    The patient reports they have a Healthcare power of attorney Living Will but have not brought a copy to the office.  Have discussed the importance of having this included in the medical record. .              I spent 15 minutes providing voluntary advance care planning services for this patient.

## 2022-05-30 NOTE — Unmapped (Cosign Needed)
This auto-generated note displays some results identified during the AWV Assessments. For full results, please see the Flowsheet Links under the Additional Documentation section of this encounter in Chart Review.          The patient reports they are currently: at home. I spent 30 minutes on the phone with the patient on the date of service. I spent an additional 5 minutes on pre- and post-visit activities on the date of service.     The patient was physically located in [place] or a state in which I am permitted to provide care. The patient and/or parent/guardian understood that s/he may incur co-pays and cost sharing, and agreed to the telemedicine visit. The visit was reasonable and appropriate under the circumstances given the patient's presentation at the time.    The patient and/or parent/guardian has been advised of the potential risks and limitations of this mode of treatment (including, but not limited to, the absence of in-person examination) and has agreed to be treated using telemedicine. The patient's/patient's family's questions regarding telemedicine have been answered.     If the visit was completed in an ambulatory setting, the patient and/or parent/guardian has also been advised to contact their provider???s office for worsening conditions, and seek emergency medical treatment and/or call 911 if the patient deems either necessary.          Patient was unable to report the following vital signs today due to visit being performed by telehealth and patient lack of required equipment to obtain: Weight, Height, Blood Pressure , Pulse, and Temperature. Please see flowsheets for any vital signs that were reported this visit.          Risks Identified:  No risks were identified at this visit, patient to continue with current care plan as recommended by provider(s). Pt reports no current education or assistance needed with current care plan. Patient has routine chronic condition follow-up appointments with PCP, CM verified in patient record.    Social Determinants of Health:  Social Determinants of Health Screened today.  Interventions Provided: N/A; No issues identified.    PCP notified of above risks and PCP made aware of HM items due via patient chart.        The following list of current providers and suppliers reviewed and updated this visit.  Patient Care Team:  Fortino Sic, MD as PCP - General (Family Medicine)  Fortino Sic, MD as PCP - General-ATTRIBUTED  Ray Tomma Lightning, OD as Consulting Physician (Optometry)  Delene Loll, MD as Consulting Physician (Dermatology)  Oneida Alar, DDS as Dentist (Dentistry)    Medications and supplements were reviewed and updated this visit. See medication list in encounter summary.     A personalized prevent plan was updated and reviewed with the patient. A copy has been provided to the patient in Patient Instructions.    Recent Hospitalizations reviewed:  No recent hospitalizations     General Health:     Patient's BMI is 20.65   Pain identified during today's visit        05/30/22 1023   PainSc: 0-No pain           Safety:   Patient answered ADL/IADL's:  Dressing: Independent    Grooming: Independent   Feeding: Independent    Bathing: Independent    Toileting: Independent    Household Duties Independent      Continence Continent          Psychosocial Assessment:  PHQ 2:  Patient had a PHQ 2 score of 0    PHQ 9: N/A    No intervention necessary Banker)    Social Determinants of Health     Financial Resource Strain: Low Risk  (05/30/2022)    Overall Financial Resource Strain (CARDIA)     Difficulty of Paying Living Expenses: Not hard at all   Internet Connectivity: No Internet connectivity concern identified (05/30/2022)    Internet Connectivity     Do you have access to internet services: Yes     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     Is your internet connection strong enough for you to watch video on your device without major problems?: Yes     Do you have enough data to get through the month?: Yes     Does at least one of the devices have a camera that you can use for video chat?: Yes   Food Insecurity: No Food Insecurity (05/30/2022)    Hunger Vital Sign     Worried About Running Out of Food in the Last Year: Never true     Ran Out of Food in the Last Year: Never true   Tobacco Use: Low Risk  (05/30/2022)    Patient History     Smoking Tobacco Use: Never     Smokeless Tobacco Use: Never     Passive Exposure: Never   Housing/Utilities: Low Risk  (05/03/2020)    Housing/Utilities     Within the past 12 months, have you ever stayed: outside, in a car, in a tent, in an overnight shelter, or temporarily in someone else's home (i.e. couch-surfing)?: No     Are you worried about losing your housing?: No     Within the past 12 months, have you been unable to get utilities (heat, electricity) when it was really needed?: No   Alcohol Use: Not At Risk (05/30/2022)    Alcohol Use     How often do you have a drink containing alcohol?: Never     How many drinks containing alcohol do you have on a typical day when you are drinking?: 1 - 2     How often do you have 5 or more drinks on one occasion?: Never   Transportation Needs: No Transportation Needs (05/30/2022)    PRAPARE - Transportation     Lack of Transportation (Medical): No     Lack of Transportation (Non-Medical): No   Substance Use: Low Risk  (05/30/2022)    Substance Use     Taken prescription drugs for non-medical reasons: Never     Taken illegal drugs: Never     Patient indicated they have taken drugs in the past year for non-medical reasons: No, [positive answer(s)]: Not on file   Health Literacy: Low Risk  (05/30/2022)    Health Literacy     : Never   Physical Activity: Sufficiently Active (05/03/2020)    Exercise Vital Sign     Days of Exercise per Week: 7 days     Minutes of Exercise per Session: 30 min   Interpersonal Safety: Not at risk (05/30/2022)    Interpersonal Safety     Unsafe Where You Currently Live: No     Physically Hurt by Anyone: No     Abused by Anyone: No   Stress: No Stress Concern Present (05/03/2020)    [employer] of Occupational Health - Occupational Stress Questionnaire     Feeling of  Stress : Not at all   Intimate Partner Violence: Not At Risk (05/30/2022)    Humiliation, Afraid, Rape, and Kick questionnaire     Fear of Current or Ex-Partner: No     Emotionally Abused: No     Physically Abused: No     Sexually Abused: No   Depression: Not at risk (05/30/2022)    PHQ-2     PHQ-2 Score: 0   Social Connections: Socially Integrated (05/30/2022)    Social Connection and Isolation Panel [NHANES]     Frequency of Communication with Friends and Family: More than three times a week     Frequency of Social Gatherings with Friends and Family: More than three times a week     Attends Religious Services: More than 4 times per year     Active Member of [employer] or Organizations: Yes     Attends Engineer, structural: More than 4 times per year     Marital Status: Married

## 2022-06-11 NOTE — Unmapped (Signed)
I called patient to schedule her prolia injection and patient stated she is not interested in getting Prolia injections at this time.  Patient stated she read the side effects of the medication and does not want to risk getting injection at this time.  Patient also stated that her husband is back in the hospital and she does not have time to get the injection at this time.  Patient advised to call our office if she changes her mind or would like to discuss further.

## 2022-07-19 NOTE — Unmapped (Signed)
Specialty Medication(s): Prolia    Ms.Kina has been dis-enrolled from the [facility] specialty pharmacy services due to  patient request .    Additional information provided to the patient:      Natalyn Szymanowski Vangie Bicker, PharmD  [facility] Specialty Pharmacist

## 2022-07-19 NOTE — Unmapped (Signed)
Patient called and said that she wants to cancel the prescription for Prolia. She stated that she do not want any more of this medicine sent to the pharmacy. She is having trouble getting the injection and she is just tired of it and stressed that she wanted it stopped.

## 2022-07-28 ENCOUNTER — Emergency Department
Admit: 2022-07-28 | Discharge: 2022-07-28 | Disposition: A | Discharge status: Home / Self Care | Payer: MEDICARE | Attending: Family Medicine

## 2022-07-28 ENCOUNTER — Ambulatory Visit
Admit: 2022-07-28 | Discharge: 2022-07-28 | Disposition: A | Discharge status: Home / Self Care | Payer: MEDICARE | Attending: Family Medicine

## 2022-07-28 DIAGNOSIS — S01511A Laceration without foreign body of lip, initial encounter: Principal | ICD-10-CM

## 2022-07-28 DIAGNOSIS — W19XXXA Unspecified fall, initial encounter: Principal | ICD-10-CM

## 2022-08-07 ENCOUNTER — Ambulatory Visit: Admit: 2022-08-07 | Discharge: 2022-08-08 | Payer: MEDICARE | Attending: Family Medicine | Primary: Family Medicine

## 2022-09-03 ENCOUNTER — Ambulatory Visit: Admit: 2022-09-03 | Discharge: 2022-09-04 | Payer: MEDICARE | Attending: Family Medicine | Primary: Family Medicine

## 2022-09-03 DIAGNOSIS — I10 Essential (primary) hypertension: Principal | ICD-10-CM

## 2022-09-03 MED ORDER — AMLODIPINE 2.5 MG TABLET
ORAL_TABLET | Freq: Every day | ORAL | 3 refills | 90 days | Status: CP
Start: 2022-09-03 — End: 2023-09-03

## 2022-11-16 ENCOUNTER — Ambulatory Visit
Admit: 2022-11-16 | Discharge: 2022-11-16 | Disposition: A | Discharge status: Home / Self Care | Payer: MEDICARE | Attending: Family Medicine

## 2022-11-16 ENCOUNTER — Emergency Department
Admit: 2022-11-16 | Discharge: 2022-11-16 | Disposition: A | Discharge status: Home / Self Care | Payer: MEDICARE | Attending: Family Medicine

## 2022-11-16 DIAGNOSIS — M25572 Pain in left ankle and joints of left foot: Principal | ICD-10-CM

## 2022-11-25 ENCOUNTER — Institutional Professional Consult (permissible substitution): Admit: 2022-11-25 | Discharge: 2022-11-26 | Payer: MEDICARE | Attending: Family Medicine | Primary: Family Medicine

## 2022-11-25 DIAGNOSIS — S93402D Sprain of unspecified ligament of left ankle, subsequent encounter: Principal | ICD-10-CM

## 2022-11-25 MED ORDER — DICLOFENAC 1 % TOPICAL GEL
Freq: Four times a day (QID) | TOPICAL | 2 refills | 38 days | Status: CP
Start: 2022-11-25 — End: 2023-11-25

## 2022-11-28 ENCOUNTER — Ambulatory Visit: Admit: 2022-11-28 | Discharge: 2022-11-29 | Payer: MEDICARE | Attending: Family Medicine | Primary: Family Medicine

## 2022-11-28 DIAGNOSIS — M2550 Pain in unspecified joint: Principal | ICD-10-CM

## 2022-11-28 DIAGNOSIS — S93402D Sprain of unspecified ligament of left ankle, subsequent encounter: Principal | ICD-10-CM

## 2022-12-01 DIAGNOSIS — M2550 Pain in unspecified joint: Principal | ICD-10-CM

## 2023-01-06 ENCOUNTER — Ambulatory Visit: Admit: 2023-01-06 | Discharge: 2023-01-07 | Payer: MEDICARE

## 2023-01-06 DIAGNOSIS — S93492A Sprain of other ligament of left ankle, initial encounter: Principal | ICD-10-CM

## 2023-03-13 ENCOUNTER — Ambulatory Visit: Admit: 2023-03-13 | Discharge: 2023-03-13 | Payer: MEDICARE

## 2023-03-13 DIAGNOSIS — F411 Generalized anxiety disorder: Principal | ICD-10-CM

## 2023-03-13 DIAGNOSIS — M2550 Pain in unspecified joint: Principal | ICD-10-CM

## 2023-03-13 DIAGNOSIS — M810 Age-related osteoporosis without current pathological fracture: Principal | ICD-10-CM

## 2023-03-13 DIAGNOSIS — E559 Vitamin D deficiency, unspecified: Principal | ICD-10-CM

## 2023-03-13 DIAGNOSIS — Z85820 Personal history of malignant melanoma of skin: Principal | ICD-10-CM

## 2023-03-13 DIAGNOSIS — E7800 Pure hypercholesterolemia, unspecified: Principal | ICD-10-CM

## 2023-03-13 DIAGNOSIS — M159 Polyosteoarthritis, unspecified: Principal | ICD-10-CM

## 2023-03-13 DIAGNOSIS — M1611 Unilateral primary osteoarthritis, right hip: Principal | ICD-10-CM

## 2023-03-20 ENCOUNTER — Ambulatory Visit: Admit: 2023-03-20 | Discharge: 2023-03-21 | Payer: MEDICARE | Attending: Family Medicine | Primary: Family Medicine

## 2023-03-20 DIAGNOSIS — M25561 Pain in right knee: Principal | ICD-10-CM

## 2023-03-20 DIAGNOSIS — I10 Essential (primary) hypertension: Principal | ICD-10-CM

## 2023-03-20 DIAGNOSIS — M25551 Pain in right hip: Principal | ICD-10-CM

## 2023-03-20 DIAGNOSIS — G8929 Other chronic pain: Principal | ICD-10-CM

## 2023-03-20 DIAGNOSIS — M810 Age-related osteoporosis without current pathological fracture: Principal | ICD-10-CM

## 2023-03-20 MED ORDER — DICLOFENAC 1 % TOPICAL GEL
Freq: Four times a day (QID) | TOPICAL | 2 refills | 38 days | Status: CP
Start: 2023-03-20 — End: 2024-03-19

## 2023-04-22 ENCOUNTER — Ambulatory Visit: Admit: 2023-04-22 | Discharge: 2023-04-23 | Payer: MEDICARE

## 2023-04-22 DIAGNOSIS — M810 Age-related osteoporosis without current pathological fracture: Principal | ICD-10-CM

## 2023-04-22 DIAGNOSIS — M1611 Unilateral primary osteoarthritis, right hip: Principal | ICD-10-CM

## 2023-05-20 DIAGNOSIS — M25551 Pain in right hip: Principal | ICD-10-CM

## 2023-06-16 ENCOUNTER — Ambulatory Visit: Admit: 2023-06-16 | Discharge: 2023-06-17 | Payer: MEDICARE | Attending: Family Medicine | Primary: Family Medicine

## 2023-06-16 DIAGNOSIS — Z9181 History of falling: Principal | ICD-10-CM

## 2023-06-16 DIAGNOSIS — M810 Age-related osteoporosis without current pathological fracture: Principal | ICD-10-CM

## 2023-06-16 DIAGNOSIS — I10 Essential (primary) hypertension: Principal | ICD-10-CM

## 2023-06-20 MED ORDER — DENOSUMAB 60 MG/ML SUBCUTANEOUS SYRINGE
SUBCUTANEOUS | 1 refills | 180 days | Status: CP
Start: 2023-06-20 — End: ?

## 2023-07-23 ENCOUNTER — Ambulatory Visit: Admit: 2023-07-23 | Discharge: 2023-07-24 | Attending: Family Medicine | Primary: Family Medicine

## 2023-07-23 DIAGNOSIS — I10 Essential (primary) hypertension: Principal | ICD-10-CM

## 2023-07-23 MED ORDER — AMLODIPINE 2.5 MG TABLET
ORAL_TABLET | ORAL | 3 refills | 90 days | Status: CP
Start: 2023-07-23 — End: 2024-07-22

## 2023-08-09 ENCOUNTER — Ambulatory Visit: Admit: 2023-08-09 | Discharge: 2023-08-10

## 2024-01-27 ENCOUNTER — Ambulatory Visit: Admit: 2024-01-27 | Discharge: 2024-01-27 | Payer: BLUE CROSS/BLUE SHIELD | Attending: Clinical | Primary: Clinical

## 2024-01-27 ENCOUNTER — Ambulatory Visit
Admit: 2024-01-27 | Discharge: 2024-01-27 | Payer: BLUE CROSS/BLUE SHIELD | Attending: Family Medicine | Primary: Family Medicine

## 2024-01-27 DIAGNOSIS — M1611 Unilateral primary osteoarthritis, right hip: Principal | ICD-10-CM

## 2024-01-27 DIAGNOSIS — M810 Age-related osteoporosis without current pathological fracture: Principal | ICD-10-CM

## 2024-01-27 DIAGNOSIS — I10 Essential (primary) hypertension: Principal | ICD-10-CM

## 2024-07-01 DIAGNOSIS — I10 Essential (primary) hypertension: Principal | ICD-10-CM

## 2024-07-01 MED ORDER — AMLODIPINE 2.5 MG TABLET
ORAL_TABLET | Freq: Every day | ORAL | 1 refills | 90.00000 days | Status: CP
Start: 2024-07-01 — End: ?

## 2024-07-29 DIAGNOSIS — M1611 Unilateral primary osteoarthritis, right hip: Principal | ICD-10-CM

## 2024-07-29 DIAGNOSIS — I10 Essential (primary) hypertension: Principal | ICD-10-CM

## 2024-07-29 DIAGNOSIS — Z9181 History of falling: Principal | ICD-10-CM

## 2024-07-29 DIAGNOSIS — M810 Age-related osteoporosis without current pathological fracture: Principal | ICD-10-CM

## 2024-07-29 MED ORDER — AMLODIPINE 2.5 MG TABLET
ORAL_TABLET | Freq: Every day | ORAL | 1 refills | 90.00000 days | Status: CP
Start: 2024-07-29 — End: ?
